# Patient Record
Sex: Male | Born: 1990 | Race: Black or African American | Hispanic: No | Marital: Single | Smoking: Current every day smoker
Health system: Southern US, Community
[De-identification: ages and names within clinical notes are randomized; demographics above are authoritative.]

## PROBLEM LIST (undated history)

## (undated) HISTORY — PX: ANTERIOR CRUCIATE LIGAMENT REPAIR: SHX115

---

## 2012-02-24 ENCOUNTER — Other Ambulatory Visit: Payer: Self-pay | Admitting: Sports Medicine

## 2012-02-24 DIAGNOSIS — R609 Edema, unspecified: Secondary | ICD-10-CM

## 2012-02-24 DIAGNOSIS — R52 Pain, unspecified: Secondary | ICD-10-CM

## 2012-02-27 ENCOUNTER — Ambulatory Visit
Admission: RE | Admit: 2012-02-27 | Discharge: 2012-02-27 | Disposition: A | Discharge status: Home / Self Care | Payer: Managed Care, Other (non HMO) | Source: Ambulatory Visit | Attending: Sports Medicine | Admitting: Sports Medicine

## 2012-02-27 DIAGNOSIS — R609 Edema, unspecified: Secondary | ICD-10-CM

## 2012-02-27 DIAGNOSIS — R52 Pain, unspecified: Secondary | ICD-10-CM

## 2013-12-28 ENCOUNTER — Emergency Department (HOSPITAL_BASED_OUTPATIENT_CLINIC_OR_DEPARTMENT_OTHER)
Admission: EM | Admit: 2013-12-28 | Discharge: 2013-12-28 | Disposition: A | Discharge status: Home / Self Care | Payer: BC Managed Care – PPO | Attending: Emergency Medicine | Admitting: Emergency Medicine

## 2013-12-28 ENCOUNTER — Encounter (HOSPITAL_BASED_OUTPATIENT_CLINIC_OR_DEPARTMENT_OTHER): Payer: Self-pay | Admitting: Emergency Medicine

## 2013-12-28 ENCOUNTER — Emergency Department (HOSPITAL_BASED_OUTPATIENT_CLINIC_OR_DEPARTMENT_OTHER): Payer: BC Managed Care – PPO

## 2013-12-28 DIAGNOSIS — N508 Other specified disorders of male genital organs: Secondary | ICD-10-CM | POA: Insufficient documentation

## 2013-12-28 DIAGNOSIS — F17200 Nicotine dependence, unspecified, uncomplicated: Secondary | ICD-10-CM | POA: Diagnosis not present

## 2013-12-28 DIAGNOSIS — N509 Disorder of male genital organs, unspecified: Secondary | ICD-10-CM | POA: Insufficient documentation

## 2013-12-28 DIAGNOSIS — N5089 Other specified disorders of the male genital organs: Secondary | ICD-10-CM

## 2013-12-28 DIAGNOSIS — N50811 Right testicular pain: Secondary | ICD-10-CM

## 2013-12-28 LAB — URINALYSIS, ROUTINE W REFLEX MICROSCOPIC
BILIRUBIN URINE: NEGATIVE
GLUCOSE, UA: NEGATIVE mg/dL
HGB URINE DIPSTICK: NEGATIVE
Ketones, ur: NEGATIVE mg/dL
Leukocytes, UA: NEGATIVE
Nitrite: NEGATIVE
PROTEIN: NEGATIVE mg/dL
Specific Gravity, Urine: 1.025 (ref 1.005–1.030)
Urobilinogen, UA: 0.2 mg/dL (ref 0.0–1.0)
pH: 5.5 (ref 5.0–8.0)

## 2013-12-28 MED ORDER — IBUPROFEN 800 MG PO TABS: 800.0000 mg | ORAL_TABLET | Freq: Three times a day (TID) | ORAL | Status: AC

## 2013-12-28 NOTE — ED Notes (Signed)
Right testicular pain for several months. Back pain. States he feels a knot.

## 2013-12-28 NOTE — ED Provider Notes (Signed)
CSN: [id number]     Arrival date & time 12/28/13  1844 History   First MD Initiated Contact with Patient 12/28/13 2032     This chart was scribed for Elwin Mocha, MD by Arlan Organ, ED Scribe. This patient was seen in room MH05/[facility]05 and the patient's care was started 8:35 PM.   Chief Complaint  Patient presents with  . Testicle Pain   Patient is a 24 y.o. male presenting with testicular pain. The history is provided by the patient. No language interpreter was used.  Testicle Pain This is a new problem. The current episode started more than 1 week ago. The problem occurs constantly. The problem has not changed since onset.Pertinent negatives include no abdominal pain. Nothing aggravates the symptoms. Nothing relieves the symptoms. He has tried nothing for the symptoms.    HPI Comments: Lance Knight is a 23 y.o. male who presents to the Emergency Department complaining of constant, moderate R sided testicular pain x 2-3 months. He describes pain as dull. He has also noted small nodules to the side of his R testicle. Mr. Reason denies any penile discharge, penile pain, or dysuria. No pain with intercourse. He denies any concerns for STI's. No known allergies to medications.   History reviewed. No pertinent past medical history. Past Surgical History  Procedure Laterality Date  . Anterior cruciate ligament repair     No family history on file. History  Substance Use Topics  . Smoking status: Current Every Day Smoker -- 0.50 packs/day    Types: Cigarettes  . Smokeless tobacco: Not on file  . Alcohol Use: Yes    Review of Systems  Gastrointestinal: Negative for abdominal pain.  Genitourinary: Positive for testicular pain. Negative for discharge, penile swelling, scrotal swelling and penile pain.  All other systems reviewed and are negative.     Allergies  Review of patient's allergies indicates no known allergies.  Home Medications   Prior to Admission medications   Not  on File   Triage Vitals: BP 169/90  Pulse 107  Temp(Src) 98.3 F (36.8 C) (Oral)  Resp 18  Ht  (1.803 m)  Wt 230 lb (104.327 kg)  BMI 32.09 kg/m2  SpO2 100%   Physical Exam  Nursing note and vitals reviewed. Constitutional: He is oriented to person, place, and time. He appears well-developed and well-nourished. No distress.  HENT:  Head: Normocephalic and atraumatic.  Mouth/Throat: No oropharyngeal exudate.  Eyes: EOM are normal. Pupils are equal, round, and reactive to light.  Neck: Normal range of motion. Neck supple.  Cardiovascular: Normal rate and regular rhythm.  Exam reveals no friction rub.   No murmur heard. Pulmonary/Chest: Effort normal and breath sounds normal. No respiratory distress. He has no wheezes. He has no rales.  Abdominal: He exhibits no distension. There is no tenderness. There is no rebound. Hernia confirmed negative in the right inguinal area and confirmed negative in the left inguinal area.  Genitourinary: Penis normal. Right testis shows mass and tenderness (small 1 mm hard lesion on R testicle). Right testis shows no swelling. Right testis is descended. Left testis shows no mass, no swelling and no tenderness.  Musculoskeletal: Normal range of motion. He exhibits no edema.  Lymphadenopathy:       Right: No inguinal adenopathy present.       Left: No inguinal adenopathy present.  Neurological: He is alert and oriented to person, place, and time.  Skin: He is not diaphoretic.    ED Course  Procedures (  including critical care time)  DIAGNOSTIC STUDIES: Oxygen Saturation is 100% on RA, Normal by my interpretation.    COORDINATION OF CARE: 8:35 PM- Will order US scrotum, GC/Chlamydia, and urinalysis. Discussed treatment plan with pt at bedside and pt agreed to plan.     Labs Review Labs Reviewed  URINALYSIS, ROUTINE W REFLEX MICROSCOPIC    Imaging Review No results found.   EKG Interpretation None      MDM   Final diagnoses:   Scrotal mass  Right testicular pain    23 year old male with right testicular pain for 2 months and small right testicular mass. No trauma. No penile discharge or concern for STD. Vitals stable. Right testicle with small hard discrete 1 mm mass on the testicle. No varicocele, hernias appreciated. 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 Given urology followup if pain continues. Stable for discharge  I personally performed the services described in this documentation, which was scribed in my presence. The recorded information has been reviewed and is accurate.    Elwin Mocha, MD 12/29/13 [id number]

## 2013-12-28 NOTE — ED Notes (Signed)
MD at bedside. 

## 2013-12-28 NOTE — Discharge Instructions (Signed)
Testicular Self-Exam  A self-examination of your testicles involves looking at and feeling your testicles for abnormal lumps or swelling. Several things can cause swelling, lumps, or pain in your testicles. Some of these causes are:  · Injuries.  · Inflammation.  · Infection.  · Accumulation of fluids around your testicle (hydrocele).  · Twisted testicles (testicular torsion).  · Testicular cancer.  Self-examination of the testicles and groin areas may be advised if you are at risk for testicular cancer. Risks for testicular cancer include:  · An undescended testicle (cryptorchidism).  · A history of previous testicular cancer.  · A family history of testicular cancer.  The testicles are easiest to examine after warm baths or showers and are more difficult to examine when you are cold. This is because the muscles attached to the testicles retract and pull them up higher or into the abdomen.  Follow these steps while you are standing:  · Hold your penis away from your body.  · Roll one testicle between your thumb and forefinger, feeling the entire testicle.  · Roll the other testicle between your thumb and forefinger, feeling the entire testicle.  Feel for lumps, swelling, or discomfort. A normal testicle is egg shaped and feels firm. It is smooth and not tender. The spermatic cord can be felt as a firm spaghetti-like cord at the back of your testicle. It is also important to examine the crease between the front of your leg and your abdomen. Feel for any bumps that are tender. These could be enlarged lymph nodes.   Document Released: 07/13/2000 Document Revised: 12/07/2012 Document Reviewed: 09/26/2012  [employer]® Patient Information ©2015 [employer]. This information is not intended to replace advice given to you by your health care provider. Make sure you discuss any questions you have with your health care provider.

## 2013-12-30 LAB — GC/CHLAMYDIA PROBE AMP
CT PROBE, AMP APTIMA: NEGATIVE
GC PROBE AMP APTIMA: NEGATIVE

## 2015-02-28 IMAGING — US US SCROTUM
1 series · 13 of 25 positions shown · non-contrast
Comparison: None.

CLINICAL DATA: Right testicular pain for 2 months, now with
increasing intensity, possible right scrotal mass.

EXAM:
SCROTAL ULTRASOUND
DOPPLER ULTRASOUND OF THE TESTICLES
TECHNIQUE: Complete ultrasound examination of the testicles, epididymis, and
other scrotal structures was performed. Color and spectral Doppler
ultrasound were also utilized to evaluate blood flow to the
testicles.

[Series 1: us scrotum · 0.08mm/px · 13 of 36 slices shown]
[im 1/36]
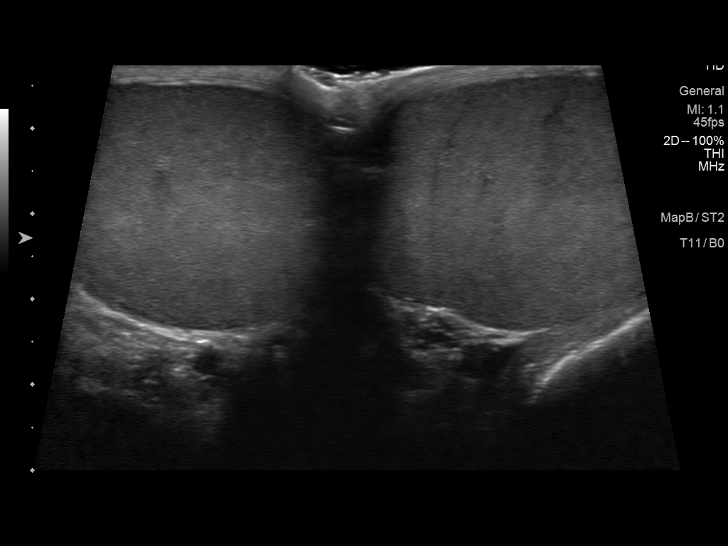
[im 3/36]
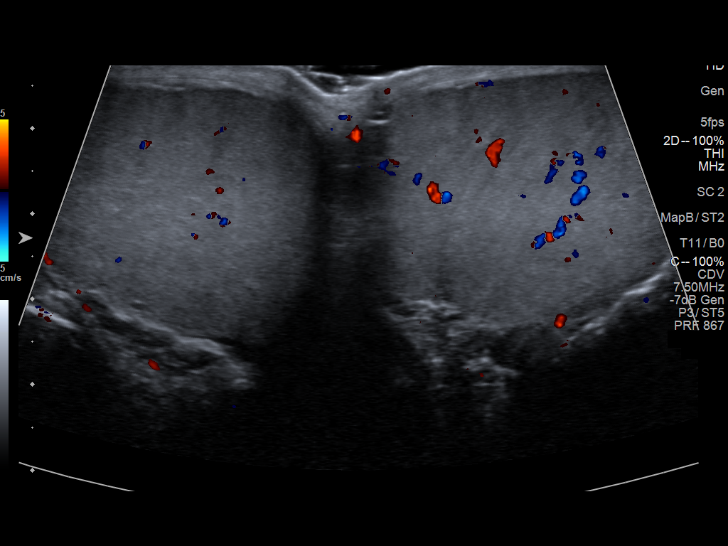
[im 6/36]
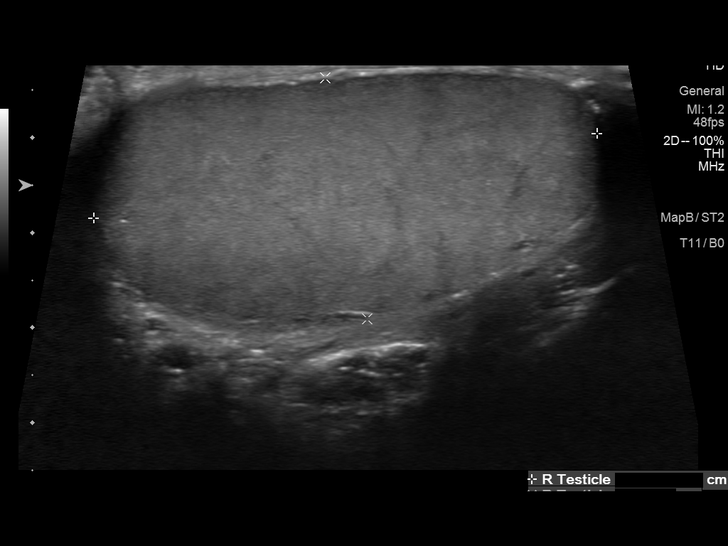
[im 9/36]
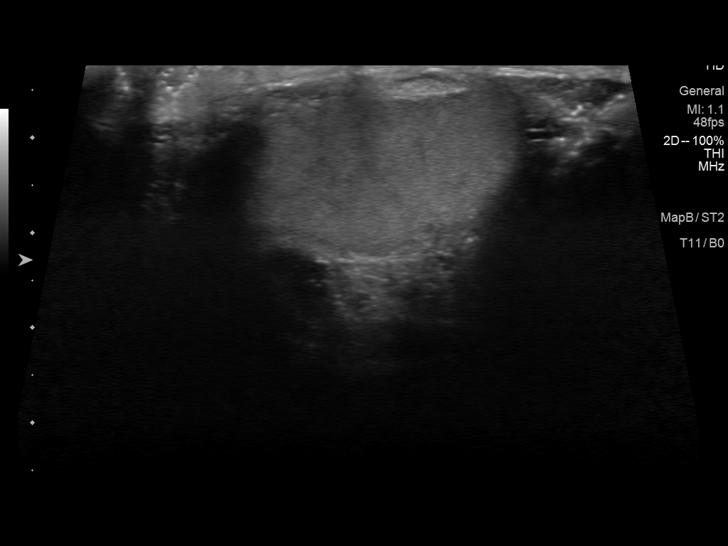
[im 12/36]
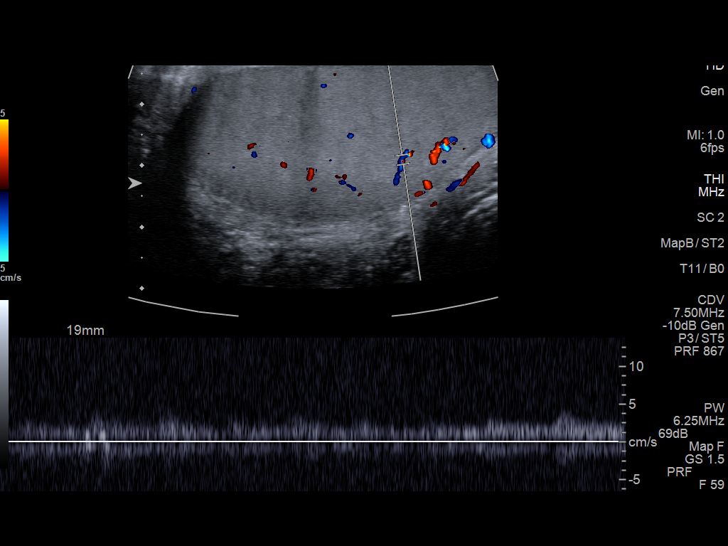
[im 15/36]
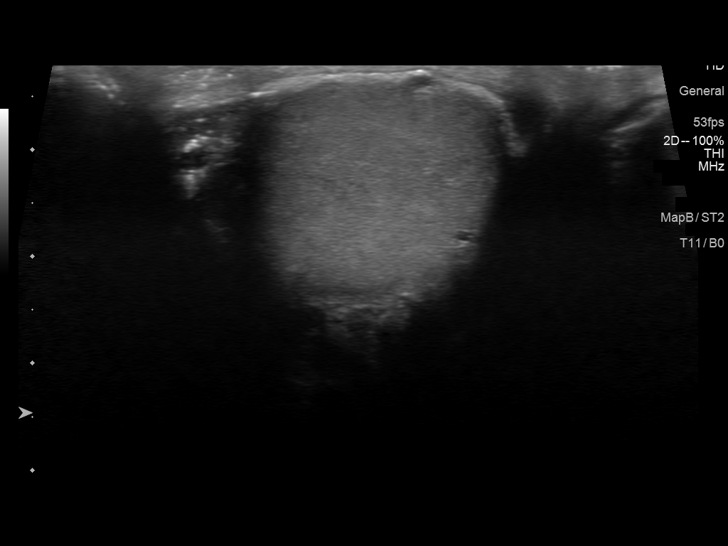
[im 18/36]
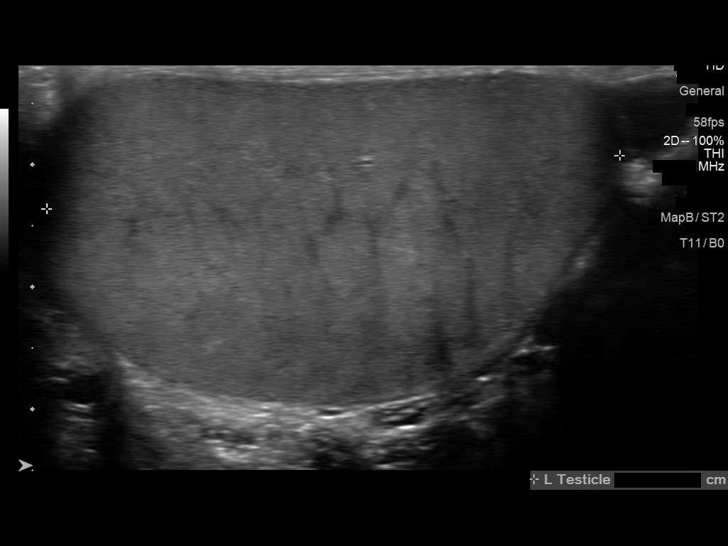
[im 21/36]
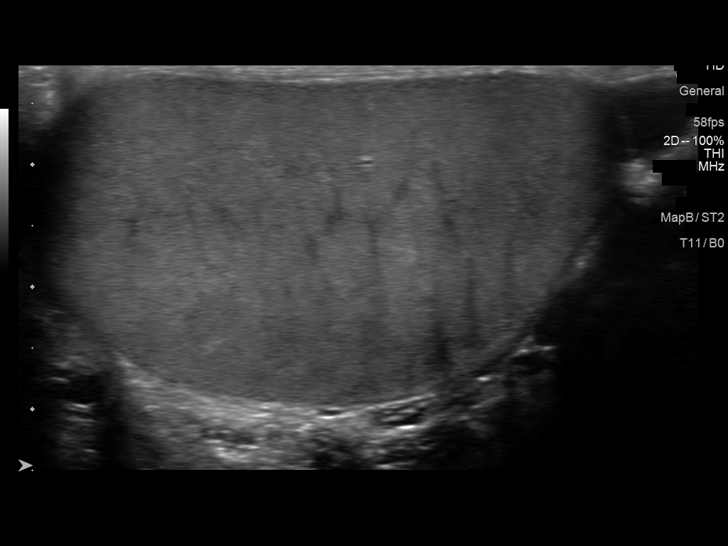
[im 24/36]
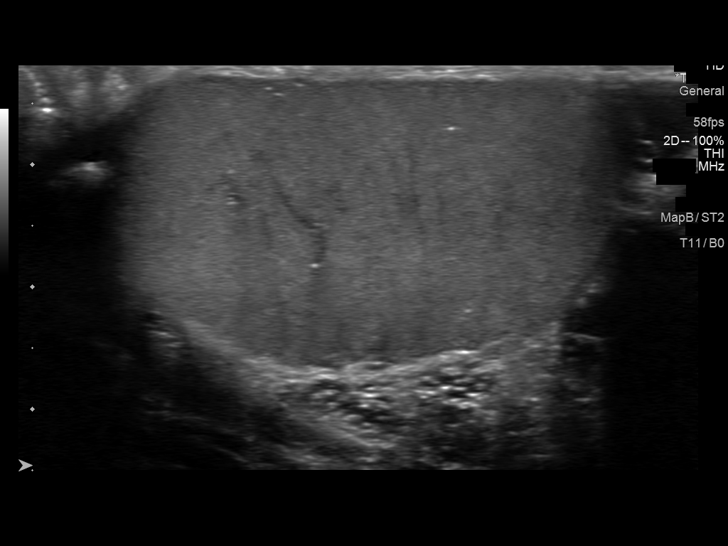
[im 27/36]
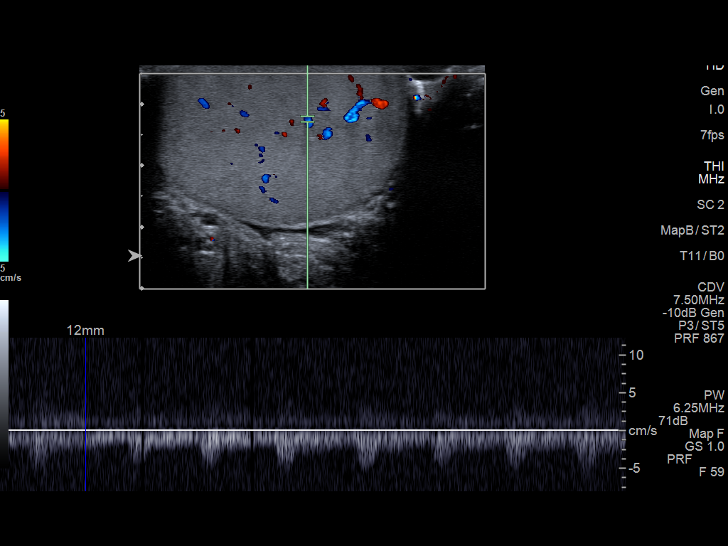
[im 30/36]
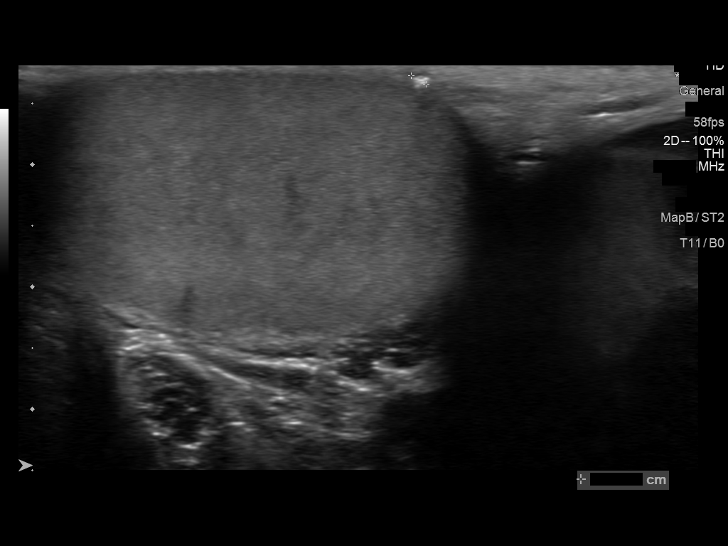
[im 33/36]
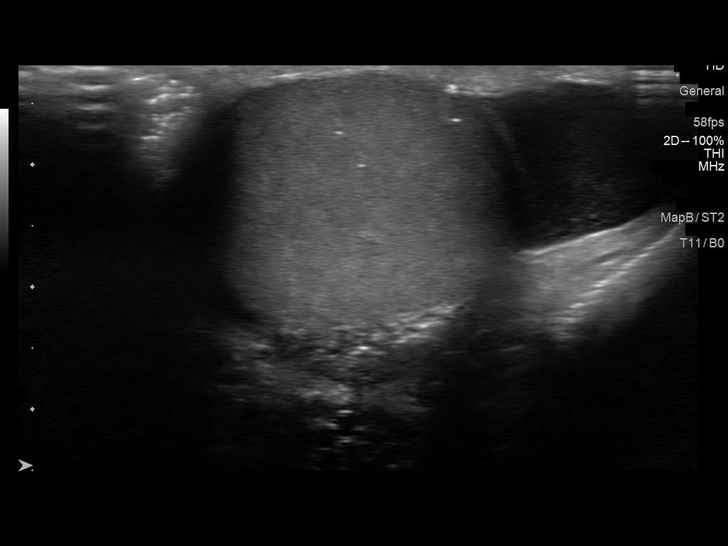
[im 36/36]
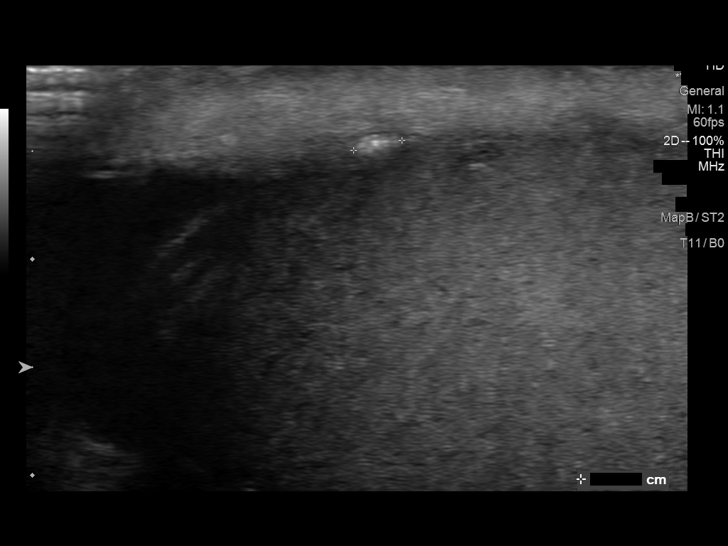

[13 of 25 positions shown; findings below may reference images not displayed]

FINDINGS: Right testicle

Measurements: 5.4 x 2.6 x 3.5 cm. No mass or microlithiasis
visualized. Echogenic 2 mm extra testicular scrotal pearl
corresponds to palpable abnormality.

Left testicle

Measurements: 4.7 x 2.6 x 3.4 cm. A few punctate microlithiasis. No
mass identified. 1 mm left extratesticular scrotal pearl.

Right epididymis:  Normal in size and appearance.

Left epididymis:  Normal in size and appearance.

Hydrocele:  Small left hydrocele.

Varicocele:  None visualized.

Pulsed Doppler interrogation of both testes demonstrates low
resistance arterial and venous waveforms bilaterally.
IMPRESSION: 2 mm scrotal pearl corresponding to palpable abnormality. No
testicular mass nor acute testicular process.

Left microlithiasis: Current literature suggests that testicular
microlithiasis is not a significant independent risk factor for
development of testicular carcinoma, and that follow up imaging is
not warranted in the absence of other risk factors. Monthly
testicular self-examination and annual physical exams are considered
appropriate surveillance. If patient has other risk factors for
testicular carcinoma, then referral to Urology should be considered.
(Reference: Hala M, et al.: A 5-Year Follow up Study of
Asymptomatic Men with Testicular Microlithiasis. J Urol 1229;
[id number].)

Small left hydrocele.

  By: Maruati Dadapeer
